# Patient Record
Sex: Male | Born: 1985 | Race: White | Hispanic: No | Marital: Single | Smoking: Current every day smoker
Health system: Southern US, Community
[De-identification: ages and names within clinical notes are randomized; demographics above are authoritative.]

## PROBLEM LIST (undated history)

## (undated) HISTORY — PX: FRACTURE SURGERY: SHX138

---

## 2007-12-11 ENCOUNTER — Emergency Department (HOSPITAL_COMMUNITY): Admission: EM | Admit: 2007-12-11 | Discharge: 2007-12-11 | Payer: Self-pay | Admitting: Emergency Medicine

## 2008-08-12 ENCOUNTER — Emergency Department: Payer: Self-pay | Admitting: Emergency Medicine

## 2008-08-22 ENCOUNTER — Emergency Department (HOSPITAL_COMMUNITY): Admission: EM | Admit: 2008-08-22 | Discharge: 2008-08-22 | Payer: Self-pay | Admitting: Emergency Medicine

## 2009-08-28 ENCOUNTER — Emergency Department: Payer: Self-pay | Admitting: Emergency Medicine

## 2012-05-17 HISTORY — PX: MANDIBLE FRACTURE SURGERY: SHX706

## 2012-05-31 ENCOUNTER — Encounter (HOSPITAL_COMMUNITY): Payer: Self-pay

## 2012-05-31 ENCOUNTER — Emergency Department (INDEPENDENT_AMBULATORY_CARE_PROVIDER_SITE_OTHER)
Admission: EM | Admit: 2012-05-31 | Discharge: 2012-05-31 | Disposition: A | Discharge status: Home / Self Care | Payer: Self-pay | Source: Home / Self Care | Attending: Family Medicine | Admitting: Family Medicine

## 2012-05-31 ENCOUNTER — Emergency Department (HOSPITAL_COMMUNITY)
Admission: RE | Admit: 2012-05-31 | Discharge: 2012-05-31 | Disposition: A | Discharge status: Home / Self Care | Payer: Self-pay | Source: Ambulatory Visit | Attending: Family Medicine | Admitting: Family Medicine

## 2012-05-31 DIAGNOSIS — IMO0001 Reserved for inherently not codable concepts without codable children: Secondary | ICD-10-CM | POA: Insufficient documentation

## 2012-05-31 DIAGNOSIS — S02609A Fracture of mandible, unspecified, initial encounter for closed fracture: Secondary | ICD-10-CM

## 2012-05-31 MED ORDER — CEPHALEXIN 500 MG PO CAPS: 1000.0000 mg | ORAL_CAPSULE | Freq: Two times a day (BID) | ORAL | Status: DC

## 2012-05-31 MED ORDER — IBUPROFEN 600 MG PO TABS: 600.0000 mg | ORAL_TABLET | Freq: Three times a day (TID) | ORAL | Status: DC | PRN

## 2012-05-31 MED ORDER — TRAMADOL HCL 50 MG PO TABS: 50.0000 mg | ORAL_TABLET | Freq: Four times a day (QID) | ORAL | Status: DC | PRN

## 2012-05-31 MED ORDER — OXYCODONE-ACETAMINOPHEN 5-325 MG PO TABS
1.0000 | ORAL_TABLET | Freq: Three times a day (TID) | ORAL | Status: DC | PRN
Start: 2012-05-31 — End: 2017-01-07
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
## 2012-07-19 ENCOUNTER — Encounter (HOSPITAL_COMMUNITY): Payer: Self-pay | Admitting: *Deleted

## 2012-07-19 ENCOUNTER — Emergency Department (INDEPENDENT_AMBULATORY_CARE_PROVIDER_SITE_OTHER)
Admission: EM | Admit: 2012-07-19 | Discharge: 2012-07-19 | Disposition: A | Discharge status: Home / Self Care | Payer: Self-pay | Source: Home / Self Care

## 2012-07-19 DIAGNOSIS — IMO0001 Reserved for inherently not codable concepts without codable children: Secondary | ICD-10-CM

## 2012-07-19 DIAGNOSIS — S02609D Fracture of mandible, unspecified, subsequent encounter for fracture with routine healing: Secondary | ICD-10-CM

## 2012-07-19 NOTE — ED Provider Notes (Signed)
Medical screening examination/treatment/procedure(s) were performed by non-physician practitioner and as supervising physician I was immediately available for consultation/collaboration.  Leslee Home, M.D.   Reuben Likes, MD 07/19/12 2020

## 2012-07-19 NOTE — ED Provider Notes (Signed)
History     CSN: [id number]  Arrival date & time 07/19/12  1437   First MD Initiated Contact with Patient 07/19/12 1550      Chief Complaint  Patient presents with  . Follow-up    (Consider location/radiation/quality/duration/timing/severity/associated sxs/prior treatment) HPI Comments: 27 year old male is here for followup status post removal of immobilizing wire device for treatment of mandible fracture. He is concerned that a fragment adjacent to the right lateral lower incisor is exposed bone tissue from the mandible and may be infected. He is able to open his mouth close it and it is well aligned. He complains of no fever, swelling, areas of erythema or dysphasia.   History reviewed. No pertinent past medical history.  Past Surgical History  Procedure Laterality Date  . Fracture surgery      No family history on file.  History  Substance Use Topics  . Smoking status: Current Every Day Smoker  . Smokeless tobacco: Not on file  . Alcohol Use: Yes      Review of Systems  All other systems reviewed and are negative.    Allergies  Review of patient's allergies indicates no known allergies.  Home Medications   Current Outpatient Rx  Name  Route  Sig  Dispense  Refill  . cephALEXin (KEFLEX) 500 MG capsule   Oral   Take 2 capsules (1,000 mg total) by mouth 2 (two) times daily.   28 capsule   0   . ibuprofen (ADVIL,MOTRIN) 600 MG tablet   Oral   Take 1 tablet (600 mg total) by mouth every 8 (eight) hours as needed for pain (take after meals).   20 tablet   0   . oxyCODONE-acetaminophen (PERCOCET/ROXICET) 5-325 MG per tablet   Oral   Take 1 tablet by mouth every 8 (eight) hours as needed.   20 tablet   0   . traMADol (ULTRAM) 50 MG tablet   Oral   Take 1 tablet (50 mg total) by mouth every 6 (six) hours as needed for pain.   15 tablet   0     BP 116/58  Pulse 60  Temp(Src) 98.3 F (36.8 C) (Oral)  SpO2 97%  Physical Exam  Constitutional: He  is oriented to person, place, and time. He appears well-developed and well-nourished. No distress.  HENT:  Mouth/Throat: Oropharynx is clear and moist. No oropharyngeal exudate.  Right lower lateral incisor is loosened and is attached to a fragment of either enamel or cortex of the mandible. There is no swelling, erythema, drainage or other signs of infection.  Neck: Normal range of motion. Neck supple.  Cardiovascular: Normal rate, regular rhythm and normal heart sounds.   Pulmonary/Chest: Effort normal and breath sounds normal. No respiratory distress.  Musculoskeletal: Normal range of motion. He exhibits no edema.  Lymphadenopathy:    He has no cervical adenopathy.  Neurological: He is alert and oriented to person, place, and time. He exhibits normal muscle tone.  Skin: Skin is warm.  Psychiatric: He has a normal mood and affect.    ED Course  Procedures (including critical care time)  Labs Reviewed - No data to display No results found.   1. Mandible fracture, with routine healing, subsequent encounter       MDM  The oral exam is remarkable for a loose lower right lateral incisor. It is adhered to an enamel-like structure that would have been under the gingiva headache not eroded. Difficult to say whether this is cortical bone or  an enamel from adjacent tooth. This particle is attached to the tooth. There is no evidence of infection. Should he develop signs and symptoms of infection he is to return promptly. Otherwise followup with Dr. Donell Beers, call for appointment. I suspect he will have to have additional bridging to stabilize the loose tooth.         Hayden Rasmussen, NP 07/19/12 [phone]
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
## 2014-12-05 IMAGING — PX DG ORTHOPANTOGRAM /PANORAMIC
1 series · 1 of 1 positions shown · non-contrast
Comparison: None available.

CLINICAL DATA: Follow-up fracture.  Status post mandible surgery 1
month ago.

ORTHOPANTOGRAM/PANORAMIC

[Series 1: — · U · 1 of 1 slices shown]
[im 1/1]
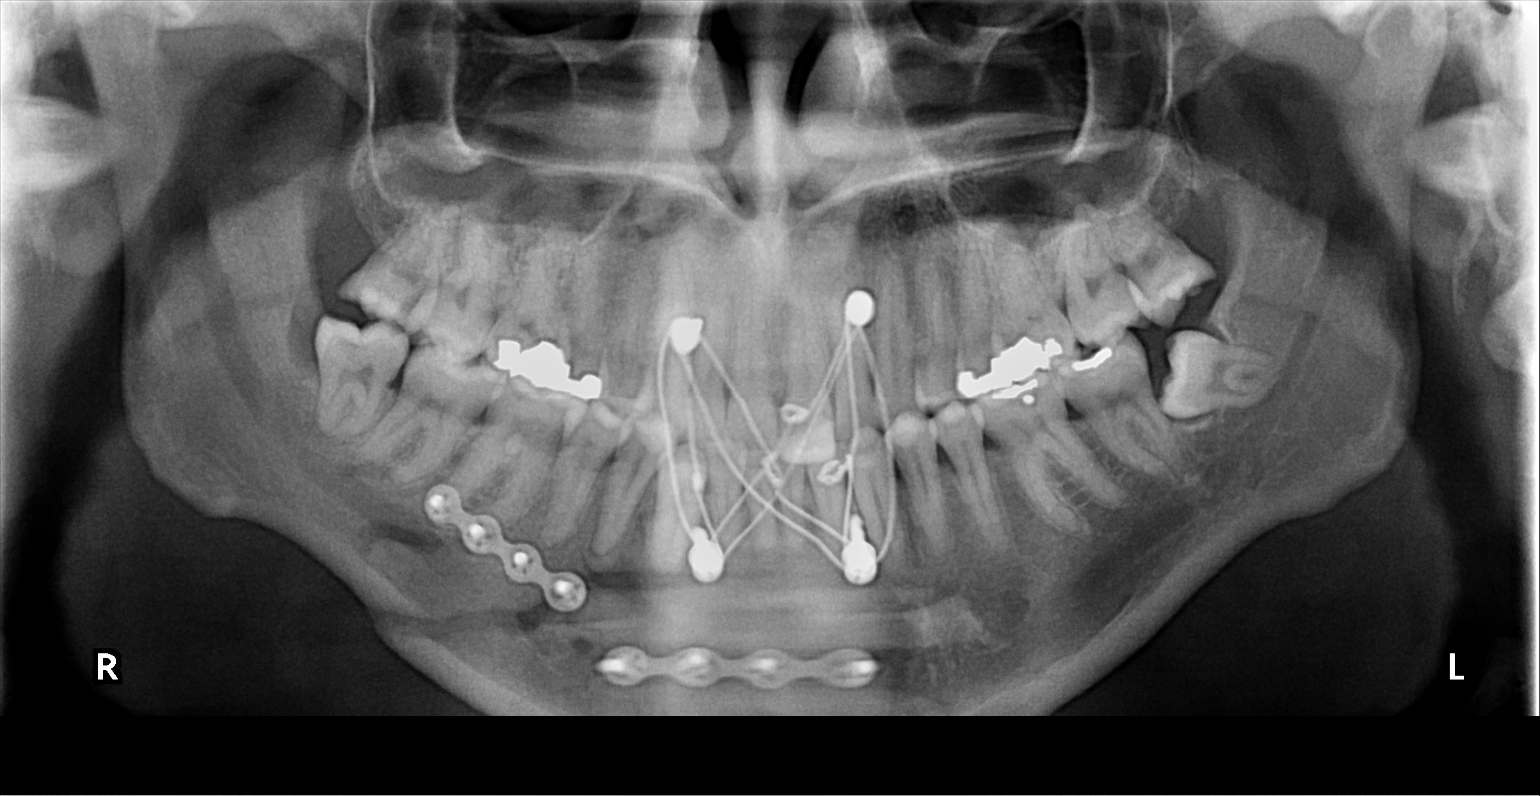

[1 of 1 positions shown; findings below may reference images not displayed]

FINDINGS: The mandible is wired closed.  Previous dental work is
noted.  Oblique fracture across the right parasymphyseal mandible
is approximated.  The patient is status post ORIF with two sets of
plate and screw fixation.  There appears to be some healing across
the fracture line.
IMPRESSION: 1.  Healing right parasymphyseal mandible fracture.
2.  The mandible is wired closed.

## 2015-05-28 ENCOUNTER — Telehealth: Payer: Self-pay | Admitting: Internal Medicine

## 2015-05-28 NOTE — Telephone Encounter (Signed)
Pt's mother is a pt of dr Alphonsus SiasLetvak and would like for this pt (her son) to be seen by Dr Alphonsus SiasLetvak sooner than your first new pt availability.  Is this ok to schedule in any 30 min slot?  cb number is [phone] Thank you

## 2015-05-28 NOTE — Telephone Encounter (Signed)
8:30 feb 15

## 2015-05-28 NOTE — Telephone Encounter (Signed)
yes

## 2016-12-01 ENCOUNTER — Ambulatory Visit: Payer: Self-pay | Admitting: Internal Medicine

## 2017-01-07 ENCOUNTER — Ambulatory Visit (INDEPENDENT_AMBULATORY_CARE_PROVIDER_SITE_OTHER): Payer: BLUE CROSS/BLUE SHIELD | Admitting: Internal Medicine

## 2017-01-07 ENCOUNTER — Encounter (INDEPENDENT_AMBULATORY_CARE_PROVIDER_SITE_OTHER): Payer: Self-pay

## 2017-01-07 ENCOUNTER — Encounter: Payer: Self-pay | Admitting: Internal Medicine

## 2017-01-07 VITALS — BP 110/84 | HR 73 | Temp 97.9°F | Ht 71.5 in | Wt 206.0 lb

## 2017-01-07 DIAGNOSIS — Z0000 Encounter for general adult medical examination without abnormal findings: Secondary | ICD-10-CM | POA: Diagnosis not present

## 2017-01-07 DIAGNOSIS — Z23 Encounter for immunization: Secondary | ICD-10-CM | POA: Diagnosis not present

## 2017-01-07 NOTE — Addendum Note (Signed)
Addended by: Gregery Na on: 01/07/2017 11:59 AM   Modules accepted: Orders

## 2017-01-07 NOTE — Assessment & Plan Note (Addendum)
Healthy Discussed cigarette cessation Fitness, DASH eating Will check lipid, glucose, HIV Discussed safety, safe sex, etc Will give Tdap to update

## 2017-01-07 NOTE — Addendum Note (Signed)
Addended by: Eual Fines on: 01/07/2017 11:59 AM   Modules accepted: Orders

## 2017-01-07 NOTE — Progress Notes (Signed)
Subjective:    Patient ID: Ian Barrera, male    DOB: 1985-08-18, 31 y.o.   MRN: [id number]  HPI Here to establish care and for physical  No health concerns Smokes--discussed  No regular exercise  No current outpatient prescriptions on file prior to visit.   No current facility-administered medications on file prior to visit.     No Known Allergies  No past medical history on file.  Past Surgical History:  Procedure Laterality Date  . FRACTURE SURGERY    . MANDIBLE FRACTURE SURGERY  2014    Family History  Problem Relation Age of Onset  . Diabetes Mother   . Hypertension Mother   . Cancer Maternal Grandmother        lung cancer    Social History   Social History  . Marital status: Single    Spouse name: N/A  . Number of children: 0  . Years of education: N/A   Occupational History  . Customer service     [employer]   Social History Main Topics  . Smoking status: Current Every Day Smoker  . Smokeless tobacco: Never Used  . Alcohol use Yes  . Drug use: Unknown  . Sexual activity: Not on file   Other Topics Concern  . Not on file   Social History Narrative  . No narrative on file   Review of Systems  Constitutional: Negative for fatigue and unexpected weight change.       Weight up some now Wears seat belt   HENT: Negative for hearing loss, tinnitus and trouble swallowing.        Has cracked tooth--needs to go to dentist  Eyes: Negative for visual disturbance.       No diplopia or unilateral vision loss  Respiratory: Negative for cough, chest tightness and shortness of breath.   Cardiovascular: Negative for chest pain, palpitations and leg swelling.  Gastrointestinal: Negative for abdominal pain, blood in stool and constipation.       No heartburn  Endocrine: Negative for polydipsia and polyuria.  Genitourinary: Negative for difficulty urinating and dysuria.       No sexual problems Has had HIV testing in past  Musculoskeletal: Negative  for back pain and joint swelling.       Some pain in left hand and trouble on keyboard-- injured years ago   Skin: Negative for rash.  Allergic/Immunologic: Positive for environmental allergies. Negative for immunocompromised state.       Uses cetirizine--mostly spring/fall  Neurological: Positive for headaches. Negative for dizziness, syncope and light-headedness.  Hematological: Negative for adenopathy. Does not bruise/bleed easily.  Psychiatric/Behavioral: Negative for dysphoric mood and sleep disturbance. The patient is not nervous/anxious.        Objective:   Physical Exam  Constitutional: He is oriented to person, place, and time. He appears well-developed and well-nourished. No distress.  HENT:  Head: Normocephalic and atraumatic.  Right Ear: External ear normal.  Left Ear: External ear normal.  Mouth/Throat: Oropharynx is clear and moist. No oropharyngeal exudate.  Eyes: Pupils are equal, round, and reactive to light. Conjunctivae are normal.  Neck: Normal range of motion. Neck supple. No thyromegaly present.  Cardiovascular: Normal rate, regular rhythm, normal heart sounds and intact distal pulses.  Exam reveals no gallop.   No murmur heard. Pulmonary/Chest: Effort normal and breath sounds normal. No respiratory distress. He has no wheezes. He has no rales.  Abdominal: Soft. There is no tenderness.  Genitourinary:  Genitourinary Comments: Normal testes  Musculoskeletal: He exhibits no edema or tenderness.  Lymphadenopathy:    He has no cervical adenopathy.  Neurological: He is alert and oriented to person, place, and time.  Skin:  Benign nevi  Psychiatric: He has a normal mood and affect.          Assessment & Plan:

## 2017-01-07 NOTE — Patient Instructions (Signed)
DASH Eating Plan DASH stands for "Dietary Approaches to Stop Hypertension." The DASH eating plan is a healthy eating plan that has been shown to reduce high blood pressure (hypertension). It may also reduce your risk for type 2 diabetes, heart disease, and stroke. The DASH eating plan may also help with weight loss. What are tips for following this plan? General guidelines  Avoid eating more than 2,300 mg (milligrams) of salt (sodium) a day. If you have hypertension, you may need to reduce your sodium intake to 1,500 mg a day.  Limit alcohol intake to no more than 1 drink a day for nonpregnant women and 2 drinks a day for men. One drink equals 12 oz of beer, 5 oz of wine, or 1 oz of hard liquor.  Work with your health care provider to maintain a healthy body weight or to lose weight. Ask what an ideal weight is for you.  Get at least 30 minutes of exercise that causes your heart to beat faster (aerobic exercise) most days of the week. Activities may include walking, swimming, or biking.  Work with your health care provider or diet and nutrition specialist (dietitian) to adjust your eating plan to your individual calorie needs. Reading food labels  Check food labels for the amount of sodium per serving. Choose foods with less than 5 percent of the Daily Value of sodium. Generally, foods with less than 300 mg of sodium per serving fit into this eating plan.  To find whole grains, look for the word "whole" as the first word in the ingredient list. Shopping  Buy products labeled as "low-sodium" or "no salt added."  Buy fresh foods. Avoid canned foods and premade or frozen meals. Cooking  Avoid adding salt when cooking. Use salt-free seasonings or herbs instead of table salt or sea salt. Check with your health care provider or pharmacist before using salt substitutes.  Do not fry foods. Cook foods using healthy methods such as baking, boiling, grilling, and broiling instead.  Cook with  heart-healthy oils, such as olive, canola, soybean, or sunflower oil. Meal planning   Eat a balanced diet that includes: ? 5 or more servings of fruits and vegetables each day. At each meal, try to fill half of your plate with fruits and vegetables. ? Up to 6-8 servings of whole grains each day. ? Less than 6 oz of lean meat, poultry, or fish each day. A 3-oz serving of meat is about the same size as a deck of cards. One egg equals 1 oz. ? 2 servings of low-fat dairy each day. ? A serving of nuts, seeds, or beans 5 times each week. ? Heart-healthy fats. Healthy fats called Omega-3 fatty acids are found in foods such as flaxseeds and coldwater fish, like sardines, salmon, and mackerel.  Limit how much you eat of the following: ? Canned or prepackaged foods. ? Food that is high in trans fat, such as fried foods. ? Food that is high in saturated fat, such as fatty meat. ? Sweets, desserts, sugary drinks, and other foods with added sugar. ? Full-fat dairy products.  Do not salt foods before eating.  Try to eat at least 2 vegetarian meals each week.  Eat more home-cooked food and less restaurant, buffet, and fast food.  When eating at a restaurant, ask that your food be prepared with less salt or no salt, if possible. What foods are recommended? The items listed may not be a complete list. Talk with your dietitian about what   dietary choices are best for you. Grains Whole-grain or whole-wheat bread. Whole-grain or whole-wheat pasta. Brown rice. Oatmeal. Quinoa. Bulgur. Whole-grain and low-sodium cereals. Pita bread. Low-fat, low-sodium crackers. Whole-wheat flour tortillas. Vegetables Fresh or frozen vegetables (raw, steamed, roasted, or grilled). Low-sodium or reduced-sodium tomato and vegetable juice. Low-sodium or reduced-sodium tomato sauce and tomato paste. Low-sodium or reduced-sodium canned vegetables. Fruits All fresh, dried, or frozen fruit. Canned fruit in natural juice (without  added sugar). Meat and other protein foods Skinless chicken or turkey. Ground chicken or turkey. Pork with fat trimmed off. Fish and seafood. Egg whites. Dried beans, peas, or lentils. Unsalted nuts, nut butters, and seeds. Unsalted canned beans. Lean cuts of beef with fat trimmed off. Low-sodium, lean deli meat. Dairy Low-fat (1%) or fat-free (skim) milk. Fat-free, low-fat, or reduced-fat cheeses. Nonfat, low-sodium ricotta or cottage cheese. Low-fat or nonfat yogurt. Low-fat, low-sodium cheese. Fats and oils Soft margarine without trans fats. Vegetable oil. Low-fat, reduced-fat, or light mayonnaise and salad dressings (reduced-sodium). Canola, safflower, olive, soybean, and sunflower oils. Avocado. Seasoning and other foods Herbs. Spices. Seasoning mixes without salt. Unsalted popcorn and pretzels. Fat-free sweets. What foods are not recommended? The items listed may not be a complete list. Talk with your dietitian about what dietary choices are best for you. Grains Baked goods made with fat, such as croissants, muffins, or some breads. Dry pasta or rice meal packs. Vegetables Creamed or fried vegetables. Vegetables in a cheese sauce. Regular canned vegetables (not low-sodium or reduced-sodium). Regular canned tomato sauce and paste (not low-sodium or reduced-sodium). Regular tomato and vegetable juice (not low-sodium or reduced-sodium). Pickles. Olives. Fruits Canned fruit in a light or heavy syrup. Fried fruit. Fruit in cream or butter sauce. Meat and other protein foods Fatty cuts of meat. Ribs. Fried meat. Bacon. Sausage. Bologna and other processed lunch meats. Salami. Fatback. Hotdogs. Bratwurst. Salted nuts and seeds. Canned beans with added salt. Canned or smoked fish. Whole eggs or egg yolks. Chicken or turkey with skin. Dairy Whole or 2% milk, cream, and half-and-half. Whole or full-fat cream cheese. Whole-fat or sweetened yogurt. Full-fat cheese. Nondairy creamers. Whipped toppings.  Processed cheese and cheese spreads. Fats and oils Butter. Stick margarine. Lard. Shortening. Ghee. Bacon fat. Tropical oils, such as coconut, palm kernel, or palm oil. Seasoning and other foods Salted popcorn and pretzels. Onion salt, garlic salt, seasoned salt, table salt, and sea salt. Worcestershire sauce. Tartar sauce. Barbecue sauce. Teriyaki sauce. Soy sauce, including reduced-sodium. Steak sauce. Canned and packaged gravies. Fish sauce. Oyster sauce. Cocktail sauce. Horseradish that you find on the shelf. Ketchup. Mustard. Meat flavorings and tenderizers. Bouillon cubes. Hot sauce and Tabasco sauce. Premade or packaged marinades. Premade or packaged taco seasonings. Relishes. Regular salad dressings. Where to find more information:  National Heart, Lung, and Blood Institute: www.nhlbi.nih.gov  American Heart Association: www.heart.org Summary  The DASH eating plan is a healthy eating plan that has been shown to reduce high blood pressure (hypertension). It may also reduce your risk for type 2 diabetes, heart disease, and stroke.  With the DASH eating plan, you should limit salt (sodium) intake to 2,300 mg a day. If you have hypertension, you may need to reduce your sodium intake to 1,500 mg a day.  When on the DASH eating plan, aim to eat more fresh fruits and vegetables, whole grains, lean proteins, low-fat dairy, and heart-healthy fats.  Work with your health care provider or diet and nutrition specialist (dietitian) to adjust your eating plan to your individual   calorie needs. This information is not intended to replace advice given to you by your health care provider. Make sure you discuss any questions you have with your health care provider. Document Released: 04/22/2011 Document Revised: 04/26/2016 Document Reviewed: 04/26/2016 Elsevier Interactive Patient Education  2017 [employer]vier Inc.  

## 2017-01-08 LAB — LIPID PANEL
Cholesterol: 155 mg/dL (ref <200)
HDL: 45 mg/dL (ref >40)
LDL CALC: 95 mg/dL (ref <100)
TRIGLYCERIDES: 75 mg/dL (ref <150)
Total CHOL/HDL Ratio: 3.4 Ratio (ref <5.0)
VLDL: 15 mg/dL (ref <30)

## 2017-01-08 LAB — GLUCOSE, RANDOM: GLUCOSE: 99 mg/dL (ref 65–99)

## 2017-01-08 LAB — HIV ANTIBODY (ROUTINE TESTING W REFLEX): HIV: NONREACTIVE

## 2017-04-12 ENCOUNTER — Other Ambulatory Visit: Payer: Self-pay

## 2017-04-12 DIAGNOSIS — Y92094 Garage of other non-institutional residence as the place of occurrence of the external cause: Secondary | ICD-10-CM | POA: Diagnosis not present

## 2017-04-12 DIAGNOSIS — F17210 Nicotine dependence, cigarettes, uncomplicated: Secondary | ICD-10-CM | POA: Insufficient documentation

## 2017-04-12 DIAGNOSIS — W228XXA Striking against or struck by other objects, initial encounter: Secondary | ICD-10-CM | POA: Insufficient documentation

## 2017-04-12 DIAGNOSIS — Y9389 Activity, other specified: Secondary | ICD-10-CM | POA: Diagnosis not present

## 2017-04-12 DIAGNOSIS — Y998 Other external cause status: Secondary | ICD-10-CM | POA: Insufficient documentation

## 2017-04-12 DIAGNOSIS — S61213A Laceration without foreign body of left middle finger without damage to nail, initial encounter: Secondary | ICD-10-CM | POA: Diagnosis present

## 2017-04-13 ENCOUNTER — Other Ambulatory Visit: Payer: Self-pay

## 2017-04-13 ENCOUNTER — Encounter (HOSPITAL_COMMUNITY): Payer: Self-pay | Admitting: Emergency Medicine

## 2017-04-13 ENCOUNTER — Emergency Department (HOSPITAL_COMMUNITY)
Admission: EM | Admit: 2017-04-13 | Discharge: 2017-04-13 | Disposition: A | Discharge status: Home / Self Care | Payer: BLUE CROSS/BLUE SHIELD | Attending: Emergency Medicine | Admitting: Emergency Medicine

## 2017-04-13 DIAGNOSIS — S61213A Laceration without foreign body of left middle finger without damage to nail, initial encounter: Secondary | ICD-10-CM

## 2017-04-13 NOTE — ED Triage Notes (Signed)
Pt was working on his garage and a piece of metal hit him on his middle finger left hand small cut present on triage with bleeding controlled.

## 2017-04-13 NOTE — ED Provider Notes (Signed)
[facility] Note   CSN: [id number] Arrival date & time: 04/12/17  2345     History   Chief Complaint Chief Complaint  Patient presents with  . Finger Injury    HPI Ian Barrera is a 31 y.o. male.  HPI  31 y.o. male, presents to the Emergency Department today due to left middle finger lac. This occurred when moving objects in garage. Piece of metal struck finger. Bleeding initially, but controlled with direct pressure. Denies pain. ROM intact. No numbness/tingling. Tetanus UTD. No other symptoms noted    History reviewed. No pertinent past medical history.  Patient Active Problem List   Diagnosis Date Noted  . Preventative health care 01/07/2017    Past Surgical History:  Procedure Laterality Date  . FRACTURE SURGERY    . MANDIBLE FRACTURE SURGERY  2014       Home Medications    Prior to Admission medications   Not on File    Family History Family History  Problem Relation Age of Onset  . Diabetes Mother   . Hypertension Mother   . Cancer Maternal Grandmother        lung cancer    Social History Social History   Tobacco Use  . Smoking status: Current Every Day Smoker  . Smokeless tobacco: Never Used  Substance Use Topics  . Alcohol use: Yes  . Drug use: Not on file     Allergies   Patient has no known allergies.   Review of Systems Review of Systems ROS reviewed and all are negative for acute change except as noted in the HPI.  Physical Exam Updated Vital Signs BP 135/72 (BP Location: Right Arm)   Pulse 96   Temp 98.3 F (36.8 C) (Oral)   Resp 18   Ht 6' (1.829 m)   Wt 93.4 kg (206 lb)   SpO2 98%   BMI 27.94 kg/m   Physical Exam  Constitutional: He is oriented to person, place, and time. Vital signs are normal. He appears well-developed and well-nourished.  HENT:  Head: Normocephalic and atraumatic.  Right Ear: Hearing normal.  Left Ear: Hearing normal.  Eyes: Conjunctivae  and EOM are normal. Pupils are equal, round, and reactive to light.  Neck: Normal range of motion. Neck supple.  Cardiovascular: Normal rate and regular rhythm.  Pulmonary/Chest: Effort normal.  Musculoskeletal: Normal range of motion.  Left 3rd digit with superficial medial laceration. Well approximated. No nail bed involvement. Cap refill <sec. ROM intact  Neurological: He is alert and oriented to person, place, and time.  Skin: Skin is warm and dry.  Psychiatric: He has a normal mood and affect. His speech is normal and behavior is normal. Thought content normal.  Nursing note and vitals reviewed.    ED Treatments / Results  Labs (all labs ordered are listed, but only abnormal results are displayed) Labs Reviewed - No data to display  EKG  EKG Interpretation None       Radiology No results found.  Procedures .Marland Kitchen.Laceration Repair Date/Time: 04/13/2017 1:06 AM Performed by: Audry PiliMohr, Tenelle Andreason, PA-C Authorized by: Audry PiliMohr, Graylee Arutyunyan, PA-C   Consent:    Consent obtained:  Verbal   Consent given by:  Patient   Risks discussed:  Pain, poor cosmetic result, need for additional repair, infection and retained foreign body Laceration details:    Location:  Finger   Finger location:  L long finger   Length (cm):  1 Repair type:    Repair type:  Simple Pre-procedure details:    Preparation:  Patient was prepped and draped in usual sterile fashion Exploration:    Hemostasis achieved with:  Direct pressure   Wound exploration: wound explored through full range of motion and entire depth of wound probed and visualized   Treatment:    Area cleansed with:  Hibiclens and saline   Amount of cleaning:  Standard   Irrigation solution:  Sterile water Skin repair:    Repair method:  Tissue adhesive Approximation:    Approximation:  Close   Vermilion border: well-aligned   Post-procedure details:    Dressing:  Open (no dressing)   Patient tolerance of procedure:  Tolerated well, no immediate  complications   (including critical care time)  Medications Ordered in ED Medications - No data to display   Initial Impression / Assessment and Plan / ED Course  I have reviewed the triage vital signs and the nursing notes.  Pertinent labs & imaging results that were available during my care of the patient were reviewed by me and considered in my medical decision making (see chart for details).  Final Clinical Impressions(s) / ED Diagnoses     {I have reviewed the relevant previous healthcare records.  {I obtained HPI from historian.   ED Course:  Assessment: Patient is a 31 y.o. male that presents with laceration to left middle finger. Tdap booster UTD. Pressure irrigation performed. Bottom of the wound visualized with bleeding controled. Laceration occurred < 8 hours prior to repair which was well tolerated. Pt has no co morbidities to effect normal wound healing. Repaired with tissue adhesive. Pt is hemodynamically stable w no complaints prior to dc.   Disposition/Plan:  DC home Additional Verbal discharge instructions given and discussed with patient.  Pt Instructed to f/u with PCP in the next week for evaluation and treatment of symptoms. Return precautions given Pt acknowledges and agrees with plan  Supervising Physician Azalia Bilisampos, Kevin, MD  Final diagnoses:  Laceration of left middle finger without foreign body without damage to nail, initial encounter    ED Discharge Orders    None       Audry PiliMohr, Kam Rahimi, PA-C 04/13/17 0106    Azalia Bilisampos, Kevin, MD 04/13/17 [phone]

## 2017-04-15 ENCOUNTER — Encounter: Payer: Self-pay | Admitting: Internal Medicine

## 2017-04-15 ENCOUNTER — Ambulatory Visit: Payer: BLUE CROSS/BLUE SHIELD | Admitting: Internal Medicine

## 2017-04-15 VITALS — BP 126/70 | HR 64 | Temp 97.8°F | Wt 211.0 lb

## 2017-04-15 DIAGNOSIS — S61219A Laceration without foreign body of unspecified finger without damage to nail, initial encounter: Secondary | ICD-10-CM | POA: Insufficient documentation

## 2017-04-15 DIAGNOSIS — S61313A Laceration without foreign body of left middle finger with damage to nail, initial encounter: Secondary | ICD-10-CM | POA: Diagnosis not present

## 2017-04-15 NOTE — Progress Notes (Signed)
   Subjective:    Patient ID: Ian Barrera, male    DOB: November 01, 1985, 31 y.o.   MRN: [id number]  HPI Here for ER follow up  Cut left 3rd finger on a piece of metal Construction pipe---new  Reviewed ER note Compression for hemostasis Flushed then glued together  No problems since then Did sleep on his hand---reopened a bit No bleeding or pus Wraps it for work--with splint No pain  No current outpatient medications on file prior to visit.   No current facility-administered medications on file prior to visit.     No Known Allergies  History reviewed. No pertinent past medical history.  Past Surgical History:  Procedure Laterality Date  . FRACTURE SURGERY    . MANDIBLE FRACTURE SURGERY  2014    Family History  Problem Relation Age of Onset  . Diabetes Mother   . Hypertension Mother   . Cancer Maternal Grandmother        lung cancer    Social History   Socioeconomic History  . Marital status: Single    Spouse name: Not on file  . Number of children: 0  . Years of education: Not on file  . Highest education level: Not on file  Social Needs  . Financial resource strain: Not on file  . Food insecurity - worry: Not on file  . Food insecurity - inability: Not on file  . Transportation needs - medical: Not on file  . Transportation needs - non-medical: Not on file  Occupational History  . Occupation: Customer service    Comment: [employer]  Tobacco Use  . Smoking status: Current Every Day Smoker  . Smokeless tobacco: Never Used  Substance and Sexual Activity  . Alcohol use: Yes  . Drug use: Not on file  . Sexual activity: Not on file  Other Topics Concern  . Not on file  Social History Narrative  . Not on file   Review of Systems Not sick No fever Appetite is fine    Objective:   Physical Exam  Skin:  L shaped laceration along side of left 3rd fingernail. Opposed now and no sig gaping No fluctuance, warmth or redness Not really tender           Assessment & Plan:

## 2017-04-15 NOTE — Assessment & Plan Note (Signed)
Healing well  No signs of infection Discussed home care plans

## 2017-12-26 ENCOUNTER — Ambulatory Visit (HOSPITAL_COMMUNITY)
Admission: EM | Admit: 2017-12-26 | Discharge: 2017-12-26 | Disposition: A | Discharge status: Home / Self Care | Payer: BLUE CROSS/BLUE SHIELD | Attending: Family Medicine | Admitting: Family Medicine

## 2017-12-26 ENCOUNTER — Encounter (HOSPITAL_COMMUNITY): Payer: Self-pay

## 2017-12-26 DIAGNOSIS — R21 Rash and other nonspecific skin eruption: Secondary | ICD-10-CM

## 2017-12-26 DIAGNOSIS — W57XXXA Bitten or stung by nonvenomous insect and other nonvenomous arthropods, initial encounter: Secondary | ICD-10-CM | POA: Diagnosis not present

## 2017-12-26 DIAGNOSIS — R5383 Other fatigue: Secondary | ICD-10-CM | POA: Diagnosis not present

## 2017-12-26 DIAGNOSIS — R509 Fever, unspecified: Secondary | ICD-10-CM

## 2017-12-26 DIAGNOSIS — R51 Headache: Secondary | ICD-10-CM

## 2017-12-26 MED ORDER — DOXYCYCLINE HYCLATE 100 MG PO TABS: 100.0000 mg | ORAL_TABLET | Freq: Two times a day (BID) | ORAL | 0 refills | Status: AC

## 2017-12-26 NOTE — ED Triage Notes (Signed)
Pt presents with possible tick bites ; Pt complains of ongoing fever and weakness

## 2017-12-26 NOTE — ED Provider Notes (Signed)
[facility]    CSN: [id number] Arrival date & time: 12/26/17  1611     History   Chief Complaint Chief Complaint  Patient presents with  . Tick Bite  . Fever and weakness    HPI Ian Barrera is a 32 y.o. male.   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  He tried to remove all of them and then took multiple showers.  He was with a friend who is been [facility] with [facility].  Since Saturday, patient has noted low-grade fever at night, myalgias, fatigue, and new rash in his left axilla.  He is also had a headache.   History reviewed. No pertinent past medical history.  Patient Active Problem List   Diagnosis Date Noted  . Laceration of finger 04/15/2017  . Preventative health care 01/07/2017    Past Surgical History:  Procedure Laterality Date  . FRACTURE SURGERY    . MANDIBLE FRACTURE SURGERY  2014       Home Medications    Prior to Admission medications   Medication Sig Start Date End Date Taking? Authorizing Provider  doxycycline (VIBRA-TABS) 100 MG tablet Take 1 tablet (100 mg total) by mouth 2 (two) times daily. 12/26/17   Elvina SidleLauenstein, Tallen Schnorr, MD    Family History Family History  Problem Relation Age of Onset  . Diabetes Mother   . Hypertension Mother   . Cancer Maternal Grandmother        lung cancer    Social History Social History   Tobacco Use  . Smoking status: Current Every Day Smoker  . Smokeless tobacco: Never Used  Substance Use Topics  . Alcohol use: Yes  . Drug use: Not on file     Allergies   Patient has no known allergies.   Review of Systems Review of Systems  Constitutional: Positive for fatigue and fever.  Musculoskeletal: Positive for myalgias.  Skin: Positive for rash.  Neurological: Positive for headaches.     Physical Exam Triage Vital Signs ED Triage Vitals [12/26/17 1716]  Enc Vitals Group     BP 130/79     Pulse Rate 71     Resp 20     Temp      Temp Source Oral     SpO2 98 %     Weight      Height      Head Circumference      Peak Flow      Pain Score      Pain Loc      Pain Edu?      Excl. in GC?    No data found.  Updated Vital Signs BP 130/79 (BP Location: Left Arm)   Pulse 71   Resp 20   SpO2 98%   Physical Exam  Constitutional: He is oriented to person, place, and time. He appears well-developed and well-nourished.  HENT:  Head: Normocephalic and atraumatic.  Right Ear: External ear normal.  Left Ear: External ear normal.  Mouth/Throat: Oropharynx is clear and moist.  Eyes: Pupils are equal, round, and reactive to light. Conjunctivae and EOM are normal.  Neck: Normal range of motion. Neck supple.  Pulmonary/Chest: Effort normal.  Musculoskeletal: Normal range of motion.  Lymphadenopathy:    He has no cervical adenopathy.  Neurological: He is alert and oriented to person, place, and time.  Skin: Skin is warm and  dry.  Patient has multiple erythematous papules on both legs.  He has a petechial rash measuring about 1 cm in his left axilla.  Nursing note and vitals reviewed.    UC Treatments / Results  Labs (all labs ordered are listed, but only abnormal results are displayed) Labs Reviewed - No data to display  EKG None  Radiology No results found.  Procedures Procedures (including critical care time)  Medications Ordered in UC Medications - No data to display  Initial Impression / Assessment and Plan / UC Course  I have reviewed the triage vital signs and the nursing notes.  Pertinent labs & imaging results that were available during my care of the patient were reviewed by me and considered in my medical decision making (see chart for details).      Final Clinical Impressions(s) / UC Diagnoses   Final diagnoses:  Tick bite, initial encounter  Rash and nonspecific skin eruption     Discharge Instructions     Take the  doxycycline with food and avoid being out in the sun for extended periods.  Please return if you start feeling worse over the next several days.    ED Prescriptions    Medication Sig Dispense Auth. Provider   doxycycline (VIBRA-TABS) 100 MG tablet Take 1 tablet (100 mg total) by mouth 2 (two) times daily. 20 tablet Elvina SidleLauenstein, Maragret Vanacker, MD     Controlled Substance Prescriptions [place] Controlled Substance Registry consulted? Not Applicable   Elvina SidleLauenstein, Wiatt Mahabir, MD 12/26/17 [phone]

## 2017-12-26 NOTE — Discharge Instructions (Addendum)
Take the doxycycline with food and avoid being out in the sun for extended periods.  Please return if you start feeling worse over the next several days.
